# Patient Record
Sex: Male | Born: 1985 | Race: White | Hispanic: No | Marital: Married | Smoking: Former smoker
Health system: Southern US, Community
[De-identification: ages and names within clinical notes are randomized; demographics above are authoritative.]

## PROBLEM LIST (undated history)

## (undated) DIAGNOSIS — Z8619 Personal history of other infectious and parasitic diseases: Secondary | ICD-10-CM

## (undated) DIAGNOSIS — E785 Hyperlipidemia, unspecified: Secondary | ICD-10-CM

## (undated) DIAGNOSIS — R194 Change in bowel habit: Secondary | ICD-10-CM

## (undated) DIAGNOSIS — Z8709 Personal history of other diseases of the respiratory system: Secondary | ICD-10-CM

## (undated) DIAGNOSIS — K219 Gastro-esophageal reflux disease without esophagitis: Secondary | ICD-10-CM

## (undated) DIAGNOSIS — T7840XA Allergy, unspecified, initial encounter: Secondary | ICD-10-CM

## (undated) DIAGNOSIS — J45909 Unspecified asthma, uncomplicated: Secondary | ICD-10-CM

## (undated) HISTORY — DX: Unspecified asthma, uncomplicated: J45.909

## (undated) HISTORY — DX: Change in bowel habit: R19.4

## (undated) HISTORY — DX: Hyperlipidemia, unspecified: E78.5

## (undated) HISTORY — DX: Allergy, unspecified, initial encounter: T78.40XA

## (undated) HISTORY — DX: Personal history of other diseases of the respiratory system: Z87.09

## (undated) HISTORY — DX: Gastro-esophageal reflux disease without esophagitis: K21.9

## (undated) HISTORY — DX: Personal history of other infectious and parasitic diseases: Z86.19

---

## 2008-03-04 LAB — HIV ANTIBODY (ROUTINE TESTING W REFLEX): HIV 1&2 Ab, 4th Generation: NEGATIVE

## 2010-08-08 ENCOUNTER — Ambulatory Visit: Payer: Self-pay | Admitting: Family Medicine

## 2011-01-28 ENCOUNTER — Encounter: Payer: Self-pay | Admitting: Family Medicine

## 2011-01-28 ENCOUNTER — Ambulatory Visit (INDEPENDENT_AMBULATORY_CARE_PROVIDER_SITE_OTHER): Payer: Managed Care, Other (non HMO) | Admitting: Family Medicine

## 2011-01-28 DIAGNOSIS — E785 Hyperlipidemia, unspecified: Secondary | ICD-10-CM | POA: Insufficient documentation

## 2011-01-28 DIAGNOSIS — Z0000 Encounter for general adult medical examination without abnormal findings: Secondary | ICD-10-CM

## 2011-01-28 DIAGNOSIS — Z8709 Personal history of other diseases of the respiratory system: Secondary | ICD-10-CM

## 2011-01-28 DIAGNOSIS — Z23 Encounter for immunization: Secondary | ICD-10-CM

## 2011-01-28 NOTE — Progress Notes (Signed)
Addended by: Josph Macho A on: 01/28/2011 11:18 AM   Modules accepted: Orders

## 2011-01-28 NOTE — Assessment & Plan Note (Signed)
Reviewed preventative protocols, updated. Tdap today. Pt to bring me copy of recent blood work. Seatbelt 100%, sunscreen use reviewed. Discussed healthy diet, lifestyle.

## 2011-01-28 NOTE — Patient Instructions (Signed)
Tdap today (tetanus and pertussis). Work on incorporating activity into routine, more fish and vegetables in diet. Drop off a copy of recent blood work or email me a copy to have in your chart [email or web address] Good to meet you today, call us with questions. Return in 1-2 years for next checkup, or as needed.

## 2011-01-28 NOTE — Progress Notes (Signed)
Subjective:    Patient ID: Jason Wood, male    DOB: [birth year], 25 y.o.   MRN: [id number]  HPI CC: new pt, establish  No concerns today, would like physical.  Preventative: Blood work last year - chol elevated last 3 years (borderline).  Will bring copy of blood work. unsure last tetanus >10 yrs. Flu shot this year 2012. Not fasting today.  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  Medications and allergies reviewed and updated in chart.  Past histories reviewed and updated if relevant as below. There is no problem list on file for this patient.  Past Medical History  Diagnosis Date  . History of asthma     as child  . History of chicken pox   . HLD (hyperlipidemia)     borderline   No past surgical history on file. History  Substance Use Topics  . Smoking status: Never Smoker   . Smokeless tobacco: Never Used  . Alcohol Use: No   Family History  Problem Relation Age of Onset  . Diabetes Father   . Cancer Father     skin  . Glaucoma Father   . Cancer Maternal Grandmother     lung, smoker  . Stroke Maternal Grandmother   . Cancer Paternal Grandmother     breast  . Diabetes Paternal Grandfather   . Cancer Paternal Grandfather 40    prostate  . Coronary artery disease Neg Hx    Allergies  Allergen Reactions  . Sulfa Drugs Cross Reactors Rash   No current outpatient prescriptions on file prior to visit.   Review of Systems  Constitutional: Negative for fever, chills, activity change, appetite change, fatigue and unexpected weight change.  HENT: Negative for hearing loss and neck pain.   Eyes: Negative for visual disturbance.  Respiratory: Negative for cough, chest tightness, shortness of breath and wheezing.   Cardiovascular: Negative for chest pain,  palpitations and leg swelling.  Gastrointestinal: Negative for nausea, vomiting, abdominal pain, diarrhea, constipation, blood in stool and abdominal distention.  Genitourinary: Negative for hematuria and difficulty urinating.  Musculoskeletal: Negative for myalgias and arthralgias.  Skin: Negative for rash.  Neurological: Negative for dizziness, seizures, syncope and headaches.  Hematological: Does not bruise/bleed easily.  Psychiatric/Behavioral: Negative for dysphoric mood. The patient is not nervous/anxious.        Objective:   Physical Exam  Nursing note and vitals reviewed. Constitutional: He is oriented to person, place, and time. He appears well-developed and well-nourished. No distress.  HENT:  Head: Normocephalic and atraumatic.  Right Ear: External ear normal.  Left Ear: External ear normal.  Nose: Nose normal.  Mouth/Throat: Oropharynx is clear and moist. No oropharyngeal exudate.  Eyes: Conjunctivae and EOM are normal. Pupils are equal, round, and reactive to light. No scleral icterus.  Neck: Normal range of motion. Neck supple. No thyromegaly present.  Cardiovascular: Normal rate, regular rhythm, normal heart sounds and intact distal pulses.   No murmur heard. Pulses:      Radial pulses are 2+ on the right side, and 2+ on the left side.  Pulmonary/Chest: Effort normal and breath sounds normal. No respiratory distress. He has no wheezes. He has no rales.  Abdominal: Soft. Bowel sounds are normal. He exhibits no distension and no mass. There is no tenderness. There is no rebound  and no guarding.  Musculoskeletal: Normal range of motion.  Lymphadenopathy:    He has no cervical adenopathy.  Neurological: He is alert and oriented to person, place, and time.       CN grossly intact, station and gait intact  Skin: Skin is warm and dry. No rash noted.  Psychiatric: He has a normal mood and affect. His behavior is normal. Judgment and thought content normal.         Assessment & Plan:

## 2011-01-28 NOTE — Assessment & Plan Note (Signed)
Pt to bring me copy of recent blood work.

## 2011-01-31 ENCOUNTER — Ambulatory Visit: Payer: Self-pay | Admitting: Family Medicine

## 2012-04-07 ENCOUNTER — Encounter: Payer: Self-pay | Admitting: Family Medicine

## 2012-04-07 ENCOUNTER — Ambulatory Visit (INDEPENDENT_AMBULATORY_CARE_PROVIDER_SITE_OTHER): Payer: Managed Care, Other (non HMO) | Admitting: Family Medicine

## 2012-04-07 VITALS — BP 118/66 | HR 86 | Temp 100.5°F | Wt 200.5 lb

## 2012-04-07 DIAGNOSIS — J45901 Unspecified asthma with (acute) exacerbation: Secondary | ICD-10-CM

## 2012-04-07 DIAGNOSIS — R05 Cough: Secondary | ICD-10-CM

## 2012-04-07 DIAGNOSIS — J111 Influenza due to unidentified influenza virus with other respiratory manifestations: Secondary | ICD-10-CM

## 2012-04-07 MED ORDER — OSELTAMIVIR PHOSPHATE 75 MG PO CAPS: 75.0000 mg | ORAL_CAPSULE | Freq: Two times a day (BID) | ORAL | Status: DC

## 2012-04-07 MED ORDER — IPRATROPIUM BROMIDE 0.02 % IN SOLN: 0.5000 mg | Freq: Once | RESPIRATORY_TRACT | Status: DC

## 2012-04-07 MED ORDER — PREDNISONE 20 MG PO TABS: ORAL_TABLET | ORAL | Status: DC

## 2012-04-07 MED ORDER — ALBUTEROL SULFATE (2.5 MG/3ML) 0.083% IN NEBU: 2.5000 mg | INHALATION_SOLUTION | Freq: Once | RESPIRATORY_TRACT | Status: DC

## 2012-04-07 MED ORDER — ALBUTEROL SULFATE HFA 108 (90 BASE) MCG/ACT IN AERS: 2.0000 | INHALATION_SPRAY | Freq: Four times a day (QID) | RESPIRATORY_TRACT | Status: DC | PRN

## 2012-04-07 NOTE — Assessment & Plan Note (Addendum)
See pt instructions for plan. Given h/o asthma, will treat with tamiflu, w/in 48 hours of starting sxs. Advised to call children's doctors for tamiflu preventatively - actually Dr. Elmer Sow patients so will send note to her. Out of work until fever free for 24 hours, declines work note.

## 2012-04-07 NOTE — Patient Instructions (Addendum)
You have influenza that has caused asthma flare. Treat with albuterol inhaler scheduled for next several days (may make you jittery). Tamiflu prescription also sent in today Steroid course provided in case asthma flare not improving with albuterol alone. Flu test today - positive Watch for persistent fevers >101, worsening productive cough, or not improving as expected.  Influenza, Adult Influenza ("the flu") is a viral infection of the respiratory tract. It occurs more often in winter months because people spend more time in close contact with one another. Influenza can make you feel very sick. Influenza easily spreads from person to person (contagious). CAUSES  Influenza is caused by a virus that infects the respiratory tract. You can catch the virus by breathing in droplets from an infected person's cough or sneeze. You can also catch the virus by touching something that was recently contaminated with the virus and then touching your mouth, nose, or eyes. SYMPTOMS  Symptoms typically last 4 to 10 days and may include:  Fever.  Chills.  Headache, body aches, and muscle aches.  Sore throat.  Chest discomfort and cough.  Poor appetite.  Weakness or feeling tired.  Dizziness.  Nausea or vomiting. DIAGNOSIS  Diagnosis of influenza is often made based on your history and a physical exam. A nose or throat swab test can be done to confirm the diagnosis. RISKS AND COMPLICATIONS You may be at risk for a more severe case of influenza if you smoke cigarettes, have diabetes, have chronic heart disease (such as heart failure) or lung disease (such as asthma), or if you have a weakened immune system. Elderly people and pregnant women are also at risk for more serious infections. The most common complication of influenza is a lung infection (pneumonia). Sometimes, this complication can require emergency medical care and may be life-threatening. PREVENTION  An annual influenza vaccination (flu  shot) is the best way to avoid getting influenza. An annual flu shot is now routinely recommended for all adults in the [place] TREATMENT  In mild cases, influenza goes away on its own. Treatment is directed at relieving symptoms. For more severe cases, your caregiver may prescribe antiviral medicines to shorten the sickness. Antibiotic medicines are not effective, because the infection is caused by a virus, not by bacteria. HOME CARE INSTRUCTIONS  Only take over-the-counter or prescription medicines for pain, discomfort, or fever as directed by your caregiver.  Use a cool mist humidifier to make breathing easier.  Get plenty of rest until your temperature returns to normal. This usually takes 3 to 4 days.  Drink enough fluids to keep your urine clear or pale yellow.  Cover your mouth and nose when coughing or sneezing, and wash your hands well to avoid spreading the virus.  Stay home from work or school until your fever has been gone for at least 1 full day. SEEK MEDICAL CARE IF:   You have chest pain or a deep cough that worsens or produces more mucus.  You have nausea, vomiting, or diarrhea. SEEK IMMEDIATE MEDICAL CARE IF:   You have difficulty breathing, shortness of breath, or your skin or nails turn bluish.  You have severe neck pain or stiffness.  You have a severe headache, facial pain, or earache.  You have a worsening or recurring fever.  You have nausea or vomiting that cannot be controlled. MAKE SURE YOU:  Understand these instructions.  Will watch your condition.  Will get help right away if you are not doing well or get worse. Document Released:  02/16/2000 Document Revised: 08/20/2011 Document Reviewed: 05/20/2011 [facility] Patient Information 2013 [place], [place].

## 2012-04-07 NOTE — Assessment & Plan Note (Signed)
Brought on by influenza - treat with alb/atrovent neb in office, albuterol inhaler prescription, and provided with steroid course in case asthma flare not improving as expected.

## 2012-04-07 NOTE — Progress Notes (Signed)
  Subjective:    Patient ID: Jason Wood, male    DOB: [birth year], 27 y.o.   MRN: [id number]  HPI CC: cough  Presents with wife, Morrie Sheldon.  2d h/o chest congestion, yesterday started coughing up green sputum.  Fever today to 100.5.  + chills.  + PNDrainage.  Chest > head congestion.  + body aches, malaise.  + SOB.  No wheezing.  So far has tried tylenol, nyquil.  No abd pain, n/v, ear or tooth pain, ST.  + sick contacts at home. No smokers at home. H/o asthma as child, not on meds for this.  No need for inhaler recently.  Did receive flu shot this year.  Past Medical History  Diagnosis Date  . History of asthma     as child  . History of chicken pox   . HLD (hyperlipidemia)     borderline     Review of Systems Per HPI    Objective:   Physical Exam  Nursing note and vitals reviewed. Constitutional: He appears well-developed and well-nourished. No distress.  HENT:  Head: Normocephalic and atraumatic.  Right Ear: Tympanic membrane, external ear and ear canal normal.  Left Ear: Tympanic membrane, external ear and ear canal normal.  Nose: Nose normal. No mucosal edema or rhinorrhea. Right sinus exhibits no maxillary sinus tenderness and no frontal sinus tenderness. Left sinus exhibits no maxillary sinus tenderness and no frontal sinus tenderness.  Mouth/Throat: Oropharynx is clear and moist. No oropharyngeal exudate.  Eyes: Conjunctivae normal and EOM are normal. Pupils are equal, round, and reactive to light. No scleral icterus.  Neck: Normal range of motion. Neck supple.  Cardiovascular: Normal rate, regular rhythm, normal heart sounds and intact distal pulses.   No murmur heard. Pulmonary/Chest: Effort normal. No respiratory distress. He has no decreased breath sounds. He has wheezes (faint end exp wheezing). He has no rhonchi. He has no rales.       tachypneic  Lymphadenopathy:    He has no cervical adenopathy.  Skin: Skin is warm and dry. No rash noted.        Assessment & Plan:

## 2013-09-08 ENCOUNTER — Telehealth: Payer: Self-pay

## 2013-09-08 NOTE — Telephone Encounter (Signed)
Pt left v/m requesting date of last tetanus inj. Left v/m pt received Tdap on 01/28/2011.

## 2014-06-08 ENCOUNTER — Ambulatory Visit (INDEPENDENT_AMBULATORY_CARE_PROVIDER_SITE_OTHER): Payer: Self-pay | Admitting: Family Medicine

## 2014-06-08 ENCOUNTER — Encounter: Payer: Self-pay | Admitting: Family Medicine

## 2014-06-08 VITALS — BP 116/78 | HR 81 | Temp 98.3°F | Ht 72.0 in | Wt 217.8 lb

## 2014-06-08 DIAGNOSIS — J069 Acute upper respiratory infection, unspecified: Secondary | ICD-10-CM

## 2014-06-08 LAB — POCT RAPID STREP A (OFFICE): RAPID STREP A SCREEN: NEGATIVE

## 2014-06-08 NOTE — Addendum Note (Signed)
Addended by: Amado Coe on: 06/08/2014 10:23 AM   Modules accepted: Orders

## 2014-06-08 NOTE — Progress Notes (Signed)
   Dr. Frederico Hamman T. Ervin Hensley, MD, [facility] [place], [address] Phone: [phone] Fax: [phone]  06/08/2014  Patient: Jason Wood., MRN: [id number], DOB: Feb 28, 1986, 29 y.o.  Primary Physician:  Ria Bush, MD  Chief Complaint: URI  Subjective:   This 29 y.o. male patient presents with runny nose, sneezing, cough, sore throat, malaise and minimal / low-grade fever . Kids were sick last week. Sunday started to feel sick - now out of class for 3 days. Felt exhausted, no muscle aches, no joint aches, some sinus congestion.   Some asthma. No inhalers. No fever.   + recent exposure to others with similar symptoms.   The patent denies sore throat as the primary complaint. Denies sthortness of breath/wheezing, high fever, chest pain, rhinits for more than 14 days, significant myalgia, otalgia, facial pain, abdominal pain, changes in bowel or bladder.  PMH, PHS, Allergies, Problem List, Medications, Family History, and Social History have all been reviewed.  ROS as above, eating and drinking - tolerating PO. Urinating normally. No excessive vomitting or diarrhea. O/w as above.  Objective:   Blood pressure 116/78, pulse 81, temperature 98.3 F (36.8 C), temperature source Oral, height 6' (1.829 m), weight 217 lb 12.8 oz (98.793 kg), SpO2 97 %.  GEN: WDWN, Non-toxic, Atraumatic, normocephalic. A and O x 3. HEENT: Oropharynx clear without exudate, MMM, no significant LAD, mild rhinnorhea Ears: TM clear, COL visualized with good landmarks CV: RRR, no m/g/r. Pulm: CTA B, no wheezes, rhonchi, or crackles, normal respiratory effort. EXT: no c/c/e Psych: well oriented, neither depressed nor anxious in appearance  Objective Data:  Assessment and Plan:   Upper respiratory infection  Supportive care reviewed with patient. See patient instruction section.  Follow-up: Return if symptoms worsen or fail to  improve.  Signed,  Maud Deed. Amar Sippel, MD   Patient's Medications  New Prescriptions   No medications on file  Previous Medications   ALBUTEROL (PROVENTIL HFA;VENTOLIN HFA) 108 (90 BASE) MCG/ACT INHALER    Inhale 2 puffs into the lungs every 6 (six) hours as needed for wheezing.  Modified Medications   No medications on file  Discontinued Medications   OSELTAMIVIR (TAMIFLU) 75 MG CAPSULE    Take 1 capsule (75 mg total) by mouth 2 (two) times daily.   PREDNISONE (DELTASONE) 20 MG TABLET    Take two pills for 4 days followed by one pill for 4 days

## 2014-06-08 NOTE — Progress Notes (Signed)
Pre visit review using our clinic review tool, if applicable. No additional management support is needed unless otherwise documented below in the visit note. 

## 2017-03-03 ENCOUNTER — Other Ambulatory Visit: Payer: Self-pay

## 2017-03-03 ENCOUNTER — Telehealth: Payer: Self-pay

## 2017-03-03 ENCOUNTER — Ambulatory Visit (INDEPENDENT_AMBULATORY_CARE_PROVIDER_SITE_OTHER): Payer: Self-pay | Admitting: Family Medicine

## 2017-03-03 ENCOUNTER — Encounter: Payer: Self-pay | Admitting: Family Medicine

## 2017-03-03 VITALS — BP 104/70 | HR 76 | Temp 98.7°F | Ht 72.0 in | Wt 218.5 lb

## 2017-03-03 DIAGNOSIS — J020 Streptococcal pharyngitis: Secondary | ICD-10-CM

## 2017-03-03 DIAGNOSIS — J029 Acute pharyngitis, unspecified: Secondary | ICD-10-CM

## 2017-03-03 LAB — POCT RAPID STREP A (OFFICE): Rapid Strep A Screen: POSITIVE — AB

## 2017-03-03 MED ORDER — AMOXICILLIN 875 MG PO TABS: 875.0000 mg | ORAL_TABLET | Freq: Two times a day (BID) | ORAL | 0 refills | Status: DC

## 2017-03-03 NOTE — Telephone Encounter (Signed)
Pt already has appt with Dr Lorelei Pont 03/03/17 at 10:45.

## 2017-03-03 NOTE — Telephone Encounter (Signed)
PLEASE NOTE: All timestamps contained within this report are represented as [place] Standard Time. CONFIDENTIALTY NOTICE: This fax transmission is intended only for the addressee. It contains information that is legally privileged, confidential or otherwise protected from use or disclosure. If you are not the intended recipient, you are strictly prohibited from reviewing, disclosing, copying using or disseminating any of this information or taking any action in reliance on or regarding this information. If you have received this fax in error, please notify us immediately by telephone so that we can arrange for its return to [place]. Phone: [phone], Toll-Free: [phone], Fax: [phone] Page: 1 of 2 Call Id: [id number] [place] Patient Name: Jason Wood Gender: Male DOB: Sep 07, 1985 Age: 31 Y 5 M 3 D Return Phone Number: [phone] (Primary) Address: City/State/Zip: [place] [address] Client [facility] - Client Client Site [facility] Physician Ria Bush - MD Contact Type Call Who Is Calling Patient / Member / Family / Caregiver Call Type Triage / Clinical Caller Name Kavan Devan Relationship To Patient Spouse Return Phone Number [phone] (Primary) Chief Complaint Sore Throat Reason for Call Symptomatic / Request for [place] states her husband was exposed to strep and now his throat is sore with a fever of 100.5 Translation No Nurse Assessment Nurse: Thad Ranger, RN, Langley Gauss Date/Time (Eastern Time): 03/02/2017 9:45:59 AM Confirm and document reason for call. If symptomatic, describe symptoms. ---Caller states her husband was exposed to strep and now his throat is sore with a fever of 100.5 Pt states dtr had strep last wk Does the patient have any new or worsening symptoms? ---Yes Will a  triage be completed? ---Yes Related visit to physician within the last 2 weeks? ---No Does the PT have any chronic conditions? (i.e. diabetes, asthma, etc.) ---No Is this a behavioral health or substance abuse call? ---No Guidelines Guideline Title Affirmed Question Affirmed Notes Nurse Date/Time Eilene Ghazi Time) Strep Throat Exposure [1] Sore throat AND [2] strep throat EXPOSURE (i.e., meets definition) within past 10 days Carmon, RN, Langley Gauss 03/02/2017 9:46:38 AM Disp. Time Eilene Ghazi Time) Disposition Final User 03/02/2017 9:49:27 AM Call PCP within 24 Hours Yes Carmon, RN, Yevette Edwards Disagree/Comply Comply Caller Understands Yes PreDisposition Call Doctor PLEASE NOTE: All timestamps contained within this report are represented as [place] Standard Time. CONFIDENTIALTY NOTICE: This fax transmission is intended only for the addressee. It contains information that is legally privileged, confidential or otherwise protected from use or disclosure. If you are not the intended recipient, you are strictly prohibited from reviewing, disclosing, copying using or disseminating any of this information or taking any action in reliance on or regarding this information. If you have received this fax in error, please notify us immediately by telephone so that we can arrange for its return to [place]. Phone: [phone], Toll-Free: [phone], Fax: [phone] Page: 2 of 2 Call Id: [id number] Care Advice Given Per Guideline CALL PCP WITHIN 24 HOURS: You need to discuss this with your doctor within the next 24 hours. SORE THROAT - For relief of sore throat: * Sip warm chicken broth or apple juice. * Suck on hard candy or a throat lozenge (OTC). * Gargle with warm salt water four times a day. To make salt water, put 1/2 teaspoon of salt in 8 oz (240 ml) of warm water. * Avoid cigarette smoke. SOFT DIET: * Eat a soft diet. * Cold  drinks, popsicles, and milk shakes are especially good. Avoid citrus fruits.  DRINK PLENTY LIQUIDS: * Drink plenty of liquids. This is important to prevent dehydation. PAIN OR FEVER MEDICINES: IBUPROFEN (E.G., MOTRIN, ADVIL): * Take 400 mg (two 200 mg pills) by mouth every 6 hours as needed. * Another choice is to take 600 mg (three 200 mg pills) by mouth every 8 hours as needed. CALL BACK IF: * You become worse. CARE ADVICE given per Strep Throat Exposure (Adult) guideline. Referrals REFERRED TO PCP OFFICE

## 2017-03-03 NOTE — Progress Notes (Signed)
Dr. Frederico Hamman T. Conroy Goracke, MD, [facility] and Sports Medicine [place], [address] Phone: [phone] Fax: [phone]  03/03/2017  Patient: Jason Cubit., MRN: [id number], DOB: March 17, 1985, 31 y.o.  Primary Physician:  Ria Bush, MD   Chief Complaint  Patient presents with  . Sore Throat   Subjective:   This 31 y.o. male patient presents with sore throat for 14 days. Subjective fevers, achiness, headache. Some nausea. No significant URI sx. No significant cough.  Son with strep  The PMH, PSH, Social History, Family History, Medications, and allergies have been reviewed in [facility], and have been updated if relevant.   Patient Active Problem List   Diagnosis Date Noted  . Asthma flare 04/07/2012  . Influenza 04/07/2012  . Healthcare maintenance 01/28/2011  . History of asthma   . HLD (hyperlipidemia)     Past Medical History:  Diagnosis Date  . History of asthma    as child  . History of chicken pox   . HLD (hyperlipidemia)    borderline    History reviewed. No pertinent surgical history.  Social History   Socioeconomic History  . Marital status: Married    Spouse name: Not on file  . Number of children: Not on file  . Years of education: Not on file  . Highest education level: Not on file  Social Needs  . Financial resource strain: Not on file  . Food insecurity - worry: Not on file  . Food insecurity - inability: Not on file  . Transportation needs - medical: Not on file  . Transportation needs - non-medical: Not on file  Occupational History  . Not on file  Tobacco Use  . Smoking status: Never Smoker  . Smokeless tobacco: Never Used  Substance and Sexual Activity  . Alcohol use: No  . Drug use: No  . Sexual activity: Not on file  Other Topics Concern  . Not on file  Social History Narrative   Caffeine: 1 cup green tea   Lives with wife, parents, son (2011), 2 outside cats   Occupation:  Optometrist for [employer]   Edu: BA   Activity: works outside on weekends   Diet: fruits daily, some vegetables, water, red meat 2x/wk, fish every other week    Family History  Problem Relation Age of Onset  . Diabetes Father   . Cancer Father        skin  . Glaucoma Father   . Cancer Maternal Grandmother        lung, smoker  . Stroke Maternal Grandmother   . Cancer Paternal Grandmother        breast  . Diabetes Paternal Grandfather   . Cancer Paternal Grandfather 74       prostate  . Coronary artery disease Neg Hx     Allergies  Allergen Reactions  . Sulfa Drugs Cross Reactors Rash    Medication list reviewed and updated in full in [facility].  GEN: Acute illness details above GI: Tolerating PO intake GU: maintaining adequate hydration and urination Pulm: No SOB Interactive and getting along well at home. Otherwise, ROS is as per the HPI.  Objective:   Blood pressure 104/70, pulse 76, temperature 98.7 F (37.1 C), temperature source Oral, height 6' (1.829 m), weight 218 lb 8 oz (99.1 kg).  Gen: WDWN, NAD; A & O x3, cooperative. Pleasant.Globally Non-toxic HEENT: Normocephalic and atraumatic. Throat: swollen tonsills without exudate R TM clear, L  TM - good landmarks, No fluid present. rhinnorhea. No frontal or maxillary sinus T. MMM NECK: Anterior cervical  LAD is present - TTP CV: RRR, No M/G/R, cap refill <2 sec PULM: Breathing comfortably in no respiratory distress. no wheezing, crackles, rhonchi EXT: No c/c/e PSYCH: Friendly, good eye contact MSK: Nml gait   Results for orders placed or performed in visit on 03/03/17  POCT rapid strep A  Result Value Ref Range   Rapid Strep A Screen Positive (A) Negative    Assessment & Plan:   Strep throat  Sore throat - Plan: POCT rapid strep A  Strep test + Treat with ABX Supportive care  Follow-up: No Follow-up on file.  Meds ordered this encounter  Medications  . amoxicillin (AMOXIL) 875 MG tablet     Sig: Take 1 tablet (875 mg total) by mouth 2 (two) times daily.    Dispense:  20 tablet    Refill:  0    Orders Placed This Encounter  Procedures  . POCT rapid strep A    Signed,  Maximillion Gill T. Sevana Grandinetti, MD     Medication List        Accurate as of 03/03/17 11:59 PM. Always use your most recent med list.          amoxicillin 875 MG tablet Commonly known as:  AMOXIL Take 1 tablet (875 mg total) by mouth 2 (two) times daily.       Where to Get Your Medications    These medications were sent to [facility] [address]  [place], [place] [address]   Phone:  [phone]   amoxicillin 875 MG tablet

## 2018-07-29 ENCOUNTER — Other Ambulatory Visit: Payer: Self-pay

## 2018-07-29 ENCOUNTER — Encounter: Payer: Self-pay | Admitting: Internal Medicine

## 2018-07-29 ENCOUNTER — Ambulatory Visit (INDEPENDENT_AMBULATORY_CARE_PROVIDER_SITE_OTHER): Payer: Self-pay | Admitting: Internal Medicine

## 2018-07-29 VITALS — Ht 71.0 in | Wt 215.0 lb

## 2018-07-29 DIAGNOSIS — K219 Gastro-esophageal reflux disease without esophagitis: Secondary | ICD-10-CM

## 2018-07-29 DIAGNOSIS — R198 Other specified symptoms and signs involving the digestive system and abdomen: Secondary | ICD-10-CM

## 2018-07-29 DIAGNOSIS — K5900 Constipation, unspecified: Secondary | ICD-10-CM

## 2018-07-29 DIAGNOSIS — R1013 Epigastric pain: Secondary | ICD-10-CM

## 2018-07-29 DIAGNOSIS — R109 Unspecified abdominal pain: Secondary | ICD-10-CM

## 2018-07-29 NOTE — Progress Notes (Signed)
Patient ID: Jason Munda., male   DOB: 1985-04-10, 33 y.o.   MRN: [id number]  This service was provided via telemedicine.  Doximity app with A/V communication [place] The provider was located in provider's GI office. The patient did consent to this telephone visit and is aware of possible charges through their insurance for this visit.   The persons participating in this telemedicine service were the patient, his wife and I. Time spent on call: 26 minutes  HPI: Jason Wood is a 33 year old male with little past medical history who is seen today to discuss change in bowel habits, constipation and indigestion.  He is seen by Doximity app with A/V communication in the setting of COVID-19 pandemic.  He reports that in the last 5 months predominantly this year he has developed a change in his bowel habit.  Initially he had constipation alternating with diarrhea but over the last several months has had constipation predominance.  He reports having small stools in "hard balls" or either stools that are narrow caliber or flat.  No blood in his stool or melena but he has had mucus in his stool.  He has developed eye discomfort above his umbilicus.  This is present rather often but can be quite sharp at times.  At times it can feel cramping in nature.  He also reports feeling incomplete evacuation.  Prior to this year he would go once per day with more long larger caliber stool or possibly every other day.  He has felt a lower abdominal fullness as well as malaise and nausea.  He has used laxatives periodically and most recently took a bottle of magnesium citrate because it had been 5 days since bowel movement.  He did have a complete bowel movement/emptying with this medicine.  Perhaps separate from this he had 2 or 3 weeks of indigestion and sour stomach.  He also reported sour or "sulfur belching".  This has gone away after using Prilosec 20 mg for a week and then twice  daily for about a week.  He has been off of Prilosec for 5 to 7 days without return of the symptoms.  He does have some mild nausea at times.  No epigastric pain.  No heartburn.  No dysphagia or odynophagia.  His weight has been stable.  He reports his father "lives on Prilosec" and his mother has had issues with her digestive system but no known celiac disease, inflammatory bowel disease or GI tract malignancy.  Past Medical History:  Diagnosis Date  . History of asthma    as child  . History of chicken pox   . HLD (hyperlipidemia)    borderline    Past Surgical History:  Procedure Laterality Date  . none      Outpatient Medications Prior to Visit  Medication Sig Dispense Refill  . amoxicillin (AMOXIL) 875 MG tablet Take 1 tablet (875 mg total) by mouth 2 (two) times daily. 20 tablet 0   No facility-administered medications prior to visit.     Allergies  Allergen Reactions  . Sulfa Drugs Cross Reactors Rash    Family History  Problem Relation Age of Onset  . Diabetes Father   . Cancer Father        skin  . Glaucoma Father   . Cancer Maternal Grandmother        lung, smoker  . Stroke Maternal Grandmother   . Cancer Paternal Grandmother  breast  . Diabetes Paternal Grandfather   . Cancer Paternal Grandfather 29       prostate  . Coronary artery disease Neg Hx   . Colon cancer Neg Hx   . Esophageal cancer Neg Hx   . Stomach cancer Neg Hx   . Pancreatic cancer Neg Hx   . Liver disease Neg Hx     Social History   Tobacco Use  . Smoking status: Former Research scientist (life sciences)  . Smokeless tobacco: Never Used  . Tobacco comment: in college-rare  Substance Use Topics  . Alcohol use: No  . Drug use: No    ROS: As per history of present illness, otherwise negative  Ht 5\' 11"  (1.803 m)   Wt 215 lb (97.5 kg)   BMI 29.99 kg/m  No physical exam, virtual visit   ASSESSMENT/PLAN: 33 year old male with little past medical history who is seen today to discuss change in  bowel habits, constipation and indigestion.   1.  Change in bowel habits/constipation//mid abdominal pain/indigestion --persistent symptoms particularly lower GI symptoms over the last 4 to 5 months and more recently upper GI symptoms as well.  We discussed further evaluation of the symptoms and I have recommended the following --TSH, CBC and celiac panel --Begin Benefiber 1 heaping tablespoon working to 2 heaping tablespoons daily --Mag citrate can be used once or twice per week if needed until further evaluation --Upper endoscopy and colonoscopy; scheduled for 08/13/2018 at 2:30 PM.  We discussed the risk, benefits and alternatives and he is agreeable and wishes to proceed.  Rule out H. pylori, IBD, structural colonic lesion. --Based on endoscopy findings we may consider Linzess or other daily laxative for some time to improve bowel function   PY:[id number], [address] [place], [place], [place] [address]

## 2018-07-30 NOTE — Patient Instructions (Addendum)
Your provider has requested that you go to the basement level for lab work. Press "B" on the elevator. The lab is located at the first door on the left as you exit the elevator.  Please purchase the following medications over the counter and take as directed: Benefiber-1 heaping tablespoon working to 2 heaping tablespoons daily Magnesium citrate-this can be used once or twice per week if needed until further evaluation  You have been scheduled for upper endoscopy and colonoscopy;  scheduled for 08/13/2018 at 2:30 PM.

## 2018-08-03 HISTORY — PX: COLONOSCOPY WITH ESOPHAGOGASTRODUODENOSCOPY (EGD): SHX5779

## 2018-08-04 ENCOUNTER — Other Ambulatory Visit (INDEPENDENT_AMBULATORY_CARE_PROVIDER_SITE_OTHER): Payer: Self-pay

## 2018-08-04 ENCOUNTER — Other Ambulatory Visit: Payer: Self-pay

## 2018-08-04 ENCOUNTER — Ambulatory Visit: Payer: Self-pay

## 2018-08-04 VITALS — Ht 71.0 in | Wt 215.0 lb

## 2018-08-04 DIAGNOSIS — K219 Gastro-esophageal reflux disease without esophagitis: Secondary | ICD-10-CM

## 2018-08-04 DIAGNOSIS — R194 Change in bowel habit: Secondary | ICD-10-CM

## 2018-08-04 DIAGNOSIS — R109 Unspecified abdominal pain: Secondary | ICD-10-CM

## 2018-08-04 DIAGNOSIS — K5900 Constipation, unspecified: Secondary | ICD-10-CM

## 2018-08-04 LAB — CBC WITH DIFFERENTIAL/PLATELET
Basophils Absolute: 0 10*3/uL (ref 0.0–0.1)
Basophils Relative: 0.6 % (ref 0.0–3.0)
Eosinophils Absolute: 0.2 10*3/uL (ref 0.0–0.7)
Eosinophils Relative: 3.8 % (ref 0.0–5.0)
HCT: 43.3 % (ref 39.0–52.0)
Hemoglobin: 14.7 g/dL (ref 13.0–17.0)
Lymphocytes Relative: 34.7 % (ref 12.0–46.0)
Lymphs Abs: 2.2 10*3/uL (ref 0.7–4.0)
MCHC: 34 g/dL (ref 30.0–36.0)
MCV: 82 fl (ref 78.0–100.0)
Monocytes Absolute: 0.5 10*3/uL (ref 0.1–1.0)
Monocytes Relative: 8.5 % (ref 3.0–12.0)
Neutro Abs: 3.3 10*3/uL (ref 1.4–7.7)
Neutrophils Relative %: 52.4 % (ref 43.0–77.0)
Platelets: 187 10*3/uL (ref 150.0–400.0)
RBC: 5.28 Mil/uL (ref 4.22–5.81)
RDW: 13.4 % (ref 11.5–15.5)
WBC: 6.3 10*3/uL (ref 4.0–10.5)

## 2018-08-04 LAB — IGA: IgA: 119 mg/dL (ref 68–378)

## 2018-08-04 LAB — TSH: TSH: 3.18 u[IU]/mL (ref 0.35–4.50)

## 2018-08-04 MED ORDER — NA SULFATE-K SULFATE-MG SULF 17.5-3.13-1.6 GM/177ML PO SOLN: 1.0000 | Freq: Once | ORAL | 0 refills | Status: AC

## 2018-08-04 NOTE — Progress Notes (Signed)
Per pt, no allergies to soy or egg products.Pt not taking any weight loss meds or using  O2 at home. Pt denies sedation problem.  Pt refused emmi video. The PV was done over the phone due to COVID-19. I verified the pt's address and he is a self pay-no insurance. I reviewed medical history and prep instructions with the pt and will mail the paperwork to the pt today. Informed pt to call our office with any questions or changes prior to his procedures. He understood.

## 2018-08-05 LAB — TISSUE TRANSGLUTAMINASE, IGA: (tTG) Ab, IgA: 1 U/mL

## 2018-08-06 ENCOUNTER — Telehealth: Payer: Self-pay

## 2018-08-06 ENCOUNTER — Ambulatory Visit: Payer: Self-pay

## 2018-08-06 NOTE — Progress Notes (Signed)
The bowel prep was changed to Plenvu. A sample of Plenvu lot #[id number],Exp 12/2018 was left at the front desk of the 3rd floor with new Plenvu instructions. The pt was notified and will call if he has any questions. Gwyndolyn Saxon in [facility]

## 2018-08-06 NOTE — Telephone Encounter (Signed)
I spoke with pt; pt has had H/A on and off for 2 wks.pt has swollen lymph nodes on lt side and today lt side of face is swollen and numb. No facial drooping. Neck muscles in back of neck hurt; no tongue or throat swelling.No loss of taste/smell,no fever,chills, cough,S/T,SOB and diarrhea. Pt has dermatology appt later this afternoon and is presently in [place]. Pt will go to [facility] for eval now. FYI to Dr Darnell Level.

## 2018-08-06 NOTE — Telephone Encounter (Signed)
Incoming call from Pt.  With a complaint of swollen lymp node  Near his jaw.  Rates pain mildly now.  Pain has been high as 7.  Somes times  Numbeness occurs.   It is on the left side of neck  About the size of 1/2 inch . Wishes to trans fer to [facility].  [facility] is not accepting trans fer Pt.  At this time recc.  Patient make appointment at [facility]   Reason for Disposition . [1] Tender node in the groin AND [2] has a sore, scratch, cut or painful red area on that leg  Answer Assessment - Initial Assessment Questions 1. LOCATION: "Where is the swollen node located?" "Is the matching node on the other side of the body also swollen?"       Left side 2. SIZE: "How big is the node?" (Inches or centimeters) (or compare to common objects such as pea, bean, marble, golf ball)       1/2 inch approx 3. ONSET: "When did the swelling start?"      2 weeks ago 4. NECK NODES: "Is there a sore throat, runny nose or other symptoms of a cold?"      *No Answer*denies 5. GROIN OR ARMPIT NODES: "Is there a sore, scratch, cut or painful red area on that arm or leg?"      denies 6. FEVER: "Do you have a fever?" If so, ask: "What is it, how was it measured, and when did it start?"      denies 7. CAUSE: "What do you think is causing the swollen lymph nodes?"     Never for about 2 weeks 8. OTHER SYMPTOMS: "Do you have any other symptoms?"     Gastro/ getting colonoscopy Thursda 9. PREGNANCY: "Is there any chance you are pregnant?" "When was your last menstrual period?"     na  Protocols used: Lee

## 2018-08-06 NOTE — Telephone Encounter (Signed)
Copied from [place] [id number]. Topic: General - Call Back - No Documentation >> Aug 06, 2018 11:58 AM Erick Blinks wrote: Reason for CRM: Pt swollen lymph nodes for over two weeks, today swelling and numbness on left side of face. Please advise

## 2018-08-06 NOTE — Telephone Encounter (Signed)
Phone call dropped attempted to call no answer.

## 2018-08-06 NOTE — Telephone Encounter (Signed)
Noted thanks. Agree UCC today.

## 2018-08-07 ENCOUNTER — Telehealth: Payer: Self-pay | Admitting: Family Medicine

## 2018-08-07 NOTE — Telephone Encounter (Signed)
I scheduled pt for in pt appointment for swollen lymph nodes x 3 weeks  But when I did covid  Screening pt was positive for headache.  He stated he has has a headache off and on for [redacted] weeks along with swollen lymp notes  Is ok for in office or do you want virtual      IN OFFICE  [phone]  lymph nodes swollen in neck x 3 weeks/rbh no cough sob difficulty breathing chills fever muscle pain (headache on and off x 3 weeks) sore throat no loss taste or smell/rbh 6/5

## 2018-08-07 NOTE — Telephone Encounter (Signed)
Did not go to [facility] yesterday as advised? Ok in office for Monday.

## 2018-08-10 ENCOUNTER — Ambulatory Visit: Payer: Self-pay | Admitting: Family Medicine

## 2018-08-10 ENCOUNTER — Encounter: Payer: Self-pay | Admitting: Family Medicine

## 2018-08-10 ENCOUNTER — Other Ambulatory Visit: Payer: Self-pay

## 2018-08-10 VITALS — BP 100/66 | HR 65 | Temp 99.0°F | Ht 71.0 in | Wt 207.0 lb

## 2018-08-10 DIAGNOSIS — R1013 Epigastric pain: Secondary | ICD-10-CM

## 2018-08-10 DIAGNOSIS — R5383 Other fatigue: Secondary | ICD-10-CM

## 2018-08-10 DIAGNOSIS — R599 Enlarged lymph nodes, unspecified: Secondary | ICD-10-CM

## 2018-08-10 DIAGNOSIS — E785 Hyperlipidemia, unspecified: Secondary | ICD-10-CM

## 2018-08-10 LAB — COMPREHENSIVE METABOLIC PANEL
ALT: 16 U/L (ref 0–53)
AST: 11 U/L (ref 0–37)
Albumin: 4.9 g/dL (ref 3.5–5.2)
Alkaline Phosphatase: 55 U/L (ref 39–117)
BUN: 15 mg/dL (ref 6–23)
CO2: 27 mEq/L (ref 19–32)
Calcium: 9.6 mg/dL (ref 8.4–10.5)
Chloride: 103 mEq/L (ref 96–112)
Creatinine, Ser: 1.09 mg/dL (ref 0.40–1.50)
GFR: 76.9 mL/min (ref >60.00)
Glucose, Bld: 97 mg/dL (ref 70–99)
Potassium: 4.2 mEq/L (ref 3.5–5.1)
Sodium: 139 mEq/L (ref 135–145)
Total Bilirubin: 0.6 mg/dL (ref 0.2–1.2)
Total Protein: 7.4 g/dL (ref 6.0–8.3)

## 2018-08-10 LAB — VITAMIN B12: Vitamin B-12: 168 pg/mL — ABNORMAL LOW (ref 211–911)

## 2018-08-10 LAB — LIPID PANEL
Cholesterol: 277 mg/dL — ABNORMAL HIGH (ref 0–200)
HDL: 38.4 mg/dL — ABNORMAL LOW (ref >39.00)
NonHDL: 239
Total CHOL/HDL Ratio: 7
Triglycerides: 226 mg/dL — ABNORMAL HIGH (ref 0.0–149.0)
VLDL: 45.2 mg/dL — ABNORMAL HIGH (ref 0.0–40.0)

## 2018-08-10 LAB — VITAMIN D 25 HYDROXY (VIT D DEFICIENCY, FRACTURES): VITD: 22.1 ng/mL — ABNORMAL LOW (ref 30.00–100.00)

## 2018-08-10 LAB — POCT RAPID STREP A (OFFICE): Rapid Strep A Screen: NEGATIVE

## 2018-08-10 LAB — LIPASE: Lipase: 7 U/L — ABNORMAL LOW (ref 11.0–59.0)

## 2018-08-10 LAB — LDL CHOLESTEROL, DIRECT: Direct LDL: 204 mg/dL

## 2018-08-10 MED ORDER — AMOXICILLIN 875 MG PO TABS: 875.0000 mg | ORAL_TABLET | Freq: Two times a day (BID) | ORAL | 0 refills | Status: AC

## 2018-08-10 NOTE — Assessment & Plan Note (Signed)
Upcoming GI eval (EGD/colonoscopy).

## 2018-08-10 NOTE — Assessment & Plan Note (Signed)
Update labs.  

## 2018-08-10 NOTE — Patient Instructions (Signed)
Strep swab today Antibiotic to hold on to. Possibly dental related swollen glands -see what dentist says.  Will await GI evaluation.

## 2018-08-10 NOTE — Assessment & Plan Note (Signed)
I only appreciated mild R sided enlarged submandibular lymph nodes but not abnormally so - in h/o dental pain and possible chipped tooth anticipate symptoms may be stemming from dental issue - he will call and schedule dental appt for further evaluation. Did provide with WASP for amoxicillin course in case he can't get in to see dentist for several weeks. He agrees with plan.

## 2018-08-10 NOTE — Progress Notes (Signed)
This visit was conducted in person.  BP 100/66 (BP Location: Left Arm, Patient Position: Sitting, Cuff Size: Large)   Pulse 65   Temp 99 F (37.2 C) (Oral)   Ht 5\' 11"  (1.803 m)   Wt 207 lb (93.9 kg)   SpO2 99%   BMI 28.87 kg/m    CC: swollen glands Subjective:    Patient ID: Jason Munda., male    DOB: September 19, 1985, 33 y.o.   MRN: [id number]  HPI: Jason Vences. is a 33 y.o. male presenting on 08/10/2018 for Swollen Lymph Nodes In Neck (Noticed them about 3 weeks ago. )   Last seen in office 02/2017.   3 wk h/o cervical tender LN swelling without significant nasal congestion, cough, rhinorrhea. No fevers/chills, night sweats. Some ST, notes discomfort when swallowing. Some fatigue off and on for months.  Occasional PNDrainage and headache improved with OTC nasal sprays.  Treating with allergy medicine with benefit.   No sick contacts at home. No known strep exposure.  Has not had mono.  Did chip L upper tooth about 3 wks ago, with some pain that has since resolved. Hasn't seen dentist yet. Several years ago, did have R upper tooth apical abscess that he didn't notice that presented as recurrent sinus infection.   Upcoming EGD/colonoscopy this year for GI issues - decreased appetite, abnormal BMs, 15 lb weight loss over the last 3 weeks, ongoing nausea and indigestion, early satiety and decreased appetite.  Takes omeprazole 20mg  PRN with some benefit.      Relevant past medical, surgical, family and social history reviewed and updated as indicated. Interim medical history since our last visit reviewed. Allergies and medications reviewed and updated. Outpatient Medications Prior to Visit  Medication Sig Dispense Refill  . omeprazole (PRILOSEC) 20 MG capsule Take 20 mg by mouth every other day.    . Wheat Dextrin (BENEFIBER PO) Take by mouth daily.     No facility-administered medications prior to visit.      Per HPI unless specifically indicated in ROS section below  Review of Systems Objective:    BP 100/66 (BP Location: Left Arm, Patient Position: Sitting, Cuff Size: Large)   Pulse 65   Temp 99 F (37.2 C) (Oral)   Ht 5\' 11"  (1.803 m)   Wt 207 lb (93.9 kg)   SpO2 99%   BMI 28.87 kg/m   Wt Readings from Last 3 Encounters:  08/10/18 207 lb (93.9 kg)  08/04/18 215 lb (97.5 kg)  07/29/18 215 lb (97.5 kg)    Physical Exam Vitals signs and nursing note reviewed.  Constitutional:      General: He is not in acute distress.    Appearance: Normal appearance. He is well-developed. He is not ill-appearing.  HENT:     Head: Normocephalic and atraumatic.     Right Ear: Hearing, tympanic membrane, ear canal and external ear normal.     Left Ear: Hearing, tympanic membrane, ear canal and external ear normal.     Ears:     Comments: No pain at TMJ or at palpation along maxilla or mandible bilaterally    Nose: Nose normal.     Right Sinus: No maxillary sinus tenderness or frontal sinus tenderness.     Left Sinus: No maxillary sinus tenderness or frontal sinus tenderness.     Comments: No sinus tenderness    Mouth/Throat:     Lips: Pink.     Mouth: Mucous membranes are moist.  Tongue: No lesions.     Pharynx: Oropharynx is clear. Uvula midline. No oropharyngeal exudate or posterior oropharyngeal erythema.     Tonsils: No tonsillar exudate or tonsillar abscesses.     Comments: No enlargement or drainage from stensen's ducts. No pain to palpation of bilateral upper or lower teeth. Evidence of prior dental work (fillings) Eyes:     General: No scleral icterus.    Conjunctiva/sclera: Conjunctivae normal.     Pupils: Pupils are equal, round, and reactive to light.  Neck:     Musculoskeletal: Normal range of motion and neck supple.  Cardiovascular:     Rate and Rhythm: Normal rate and regular rhythm.     Pulses: Normal pulses.     Heart sounds: Normal heart sounds. No murmur.  Pulmonary:     Effort: Pulmonary effort is normal. No respiratory  distress.     Breath sounds: Normal breath sounds. No wheezing, rhonchi or rales.  Abdominal:     General: Abdomen is flat. Bowel sounds are normal. There is no distension.     Palpations: Abdomen is soft. There is no hepatomegaly, splenomegaly or mass.     Tenderness: There is abdominal tenderness (mild) in the epigastric area. There is no guarding or rebound. Negative signs include Murphy's sign.     Hernia: No hernia is present.  Musculoskeletal: Normal range of motion.     Right lower leg: No edema.     Left lower leg: No edema.  Lymphadenopathy:     Head:     Right side of head: Submandibular (small subcm tender LN) adenopathy present. No submental, tonsillar, preauricular or posterior auricular adenopathy.     Left side of head: No submental, submandibular, tonsillar, preauricular or posterior auricular adenopathy.     Cervical: No cervical adenopathy.     Upper Body:     Right upper body: No supraclavicular or axillary adenopathy.     Left upper body: No supraclavicular or axillary adenopathy.     Lower Body: No right inguinal adenopathy. No left inguinal adenopathy.  Skin:    General: Skin is warm and dry.     Findings: No rash.  Neurological:     General: No focal deficit present.     Mental Status: He is alert.     Comments: CN 2-12 intact  Psychiatric:        Mood and Affect: Mood normal.        Behavior: Behavior normal.       Results for orders placed or performed in visit on 08/10/18  HIV Antibody (routine testing w rflx)  Result Value Ref Range   HIV 1&2 Ab, 4th Generation neg   POCT rapid strep A  Result Value Ref Range   Rapid Strep A Screen Negative Negative   Assessment & Plan:   Problem List Items Addressed This Visit    HLD (hyperlipidemia)    Update labs.       Relevant Orders   Comprehensive metabolic panel   Lipid panel   Glands swollen - Primary    I only appreciated mild R sided enlarged submandibular lymph nodes but not abnormally so - in  h/o dental pain and possible chipped tooth anticipate symptoms may be stemming from dental issue - he will call and schedule dental appt for further evaluation. Did provide with WASP for amoxicillin course in case he can't get in to see dentist for several weeks. He agrees with plan.       Fatigue  Will check for reversible causes of fatigue.       Relevant Orders   Vitamin B12   VITAMIN D 25 Hydroxy (Vit-D Deficiency, Fractures)   POCT rapid strep A (Completed)   Epigastric abdominal pain    Upcoming GI eval (EGD/colonoscopy).       Relevant Orders   Lipase       Meds ordered this encounter  Medications  . amoxicillin (AMOXIL) 875 MG tablet    Sig: Take 1 tablet (875 mg total) by mouth 2 (two) times daily.    Dispense:  14 tablet    Refill:  0   Orders Placed This Encounter  Procedures  . Comprehensive metabolic panel  . Lipid panel  . Vitamin B12  . VITAMIN D 25 Hydroxy (Vit-D Deficiency, Fractures)  . Lipase  . HIV Antibody (routine testing w rflx)    This external order was created through the Results Console.  Marland Kitchen POCT rapid strep A    Follow up plan: Return if symptoms worsen or fail to improve.  Ria Bush, MD

## 2018-08-10 NOTE — Assessment & Plan Note (Signed)
Will check for reversible causes of fatigue.

## 2018-08-12 ENCOUNTER — Telehealth: Payer: Self-pay | Admitting: Internal Medicine

## 2018-08-12 ENCOUNTER — Telehealth: Payer: Self-pay | Admitting: Family Medicine

## 2018-08-12 ENCOUNTER — Telehealth: Payer: Self-pay | Admitting: *Deleted

## 2018-08-12 NOTE — Telephone Encounter (Signed)
Dr. Hilarie Fredrickson,  This patient called to day because he has some symptoms that have developed/ he didn't mention at his telemedicine visit- he has lost 15 lbs in 3 weeks, he has no appetite and he has some enlarged lymph nodes in his neck- he has had those for about 3 weeks. His procedure is tomorrow and I did tell him that you would speak to him before he received sedation  Thanks, Cyril Mourning

## 2018-08-12 NOTE — Telephone Encounter (Signed)
Patient stated he had lab work done 6/2 and has not seen the results in his my chart. Calling to receive those results    Patient's Phone- [phone]

## 2018-08-12 NOTE — Telephone Encounter (Signed)
Left message on vm per dpr informing pt the 08/04/18 labs were done with Dr. Zenovia Jarred.  Suggeted he contact their office for those results.

## 2018-08-12 NOTE — Telephone Encounter (Signed)
I ordered labs on Monday. I released them via MyChart.

## 2018-08-12 NOTE — Telephone Encounter (Signed)

## 2018-08-13 ENCOUNTER — Other Ambulatory Visit: Payer: Self-pay

## 2018-08-13 ENCOUNTER — Ambulatory Visit (AMBULATORY_SURGERY_CENTER): Payer: Self-pay | Admitting: Internal Medicine

## 2018-08-13 ENCOUNTER — Encounter: Payer: Self-pay | Admitting: Internal Medicine

## 2018-08-13 VITALS — BP 125/73 | HR 64 | Temp 98.7°F | Resp 17 | Ht 71.0 in | Wt 215.0 lb

## 2018-08-13 DIAGNOSIS — K3189 Other diseases of stomach and duodenum: Secondary | ICD-10-CM

## 2018-08-13 DIAGNOSIS — D131 Benign neoplasm of stomach: Secondary | ICD-10-CM

## 2018-08-13 DIAGNOSIS — R1013 Epigastric pain: Secondary | ICD-10-CM

## 2018-08-13 DIAGNOSIS — K317 Polyp of stomach and duodenum: Secondary | ICD-10-CM

## 2018-08-13 DIAGNOSIS — K635 Polyp of colon: Secondary | ICD-10-CM

## 2018-08-13 DIAGNOSIS — K2970 Gastritis, unspecified, without bleeding: Secondary | ICD-10-CM

## 2018-08-13 DIAGNOSIS — D123 Benign neoplasm of transverse colon: Secondary | ICD-10-CM

## 2018-08-13 DIAGNOSIS — K30 Functional dyspepsia: Secondary | ICD-10-CM

## 2018-08-13 DIAGNOSIS — R194 Change in bowel habit: Secondary | ICD-10-CM

## 2018-08-13 DIAGNOSIS — E755 Other lipid storage disorders: Secondary | ICD-10-CM

## 2018-08-13 DIAGNOSIS — R634 Abnormal weight loss: Secondary | ICD-10-CM

## 2018-08-13 DIAGNOSIS — K134 Granuloma and granuloma-like lesions of oral mucosa: Secondary | ICD-10-CM

## 2018-08-13 MED ORDER — PANTOPRAZOLE SODIUM 40 MG PO TBEC: 40.0000 mg | DELAYED_RELEASE_TABLET | Freq: Every day | ORAL | 3 refills | Status: AC

## 2018-08-13 MED ORDER — OMEPRAZOLE 40 MG PO CPDR: 40.0000 mg | DELAYED_RELEASE_CAPSULE | Freq: Every day | ORAL | 3 refills | Status: DC

## 2018-08-13 MED ORDER — SODIUM CHLORIDE 0.9 % IV SOLN: 500.0000 mL | Freq: Once | INTRAVENOUS | Status: DC

## 2018-08-13 NOTE — Patient Instructions (Addendum)
Please review procedure reports for medication changes.   Handouts Provided:  Polyps  YOU HAD AN ENDOSCOPIC PROCEDURE TODAY AT [facility]:   Refer to the procedure report that was given to you for any specific questions about what was found during the examination.  If the procedure report does not answer your questions, please call your gastroenterologist to clarify.  If you requested that your care partner not be given the details of your procedure findings, then the procedure report has been included in a sealed envelope for you to review at your convenience later.  YOU SHOULD EXPECT: Some feelings of bloating in the abdomen. Passage of more gas than usual.  Walking can help get rid of the air that was put into your GI tract during the procedure and reduce the bloating. If you had a lower endoscopy (such as a colonoscopy or flexible sigmoidoscopy) you may notice spotting of blood in your stool or on the toilet paper. If you underwent a bowel prep for your procedure, you may not have a normal bowel movement for a few days.  Please Note:  You might notice some irritation and congestion in your nose or some drainage.  This is from the oxygen used during your procedure.  There is no need for concern and it should clear up in a day or so.  SYMPTOMS TO REPORT IMMEDIATELY:   Following lower endoscopy (colonoscopy or flexible sigmoidoscopy):  Excessive amounts of blood in the stool  Significant tenderness or worsening of abdominal pains  Swelling of the abdomen that is new, acute  Fever of 100F or higher   Following upper endoscopy (EGD)  Vomiting of blood or coffee ground material  New chest pain or pain under the shoulder blades  Painful or persistently difficult swallowing  New shortness of breath  Fever of 100F or higher  Black, tarry-looking stools  For urgent or emergent issues, a gastroenterologist can be reached at any hour by calling [phone].   DIET:  We  do recommend a small meal at first, but then you may proceed to your regular diet.  Drink plenty of fluids but you should avoid alcoholic beverages for 24 hours.  ACTIVITY:  You should plan to take it easy for the rest of today and you should NOT DRIVE or use heavy machinery until tomorrow (because of the sedation medicines used during the test).    FOLLOW UP: Our staff will call the number listed on your records 48-72 hours following your procedure to check on you and address any questions or concerns that you may have regarding the information given to you following your procedure. If we do not reach you, we will leave a message.  We will attempt to reach you two times.  During this call, we will ask if you have developed any symptoms of COVID 19. If you develop any symptoms (ie: fever, flu-like symptoms, shortness of breath, cough etc.) before then, please call [phone].  If you test positive for Covid 19 in the 2 weeks post procedure, please call and report this information to [place].    If any biopsies were taken you will be contacted by phone or by letter within the next 1-3 weeks.  Please call us at [phone] if you have not heard about the biopsies in 3 weeks.    SIGNATURES/CONFIDENTIALITY: You and/or your care partner have signed paperwork which will be entered into your electronic medical record.  These signatures attest to the fact that  that the information above on your After Visit Summary has been reviewed and is understood.  Full responsibility of the confidentiality of this discharge information lies with you and/or your care-partner.

## 2018-08-13 NOTE — Op Note (Signed)
[facility] Patient Name: Jason Wood Procedure Date: 08/13/2018 2:49 PM MRN: [id number] Endoscopist: Jerene Bears , MD Age: 33 Referring MD:  Date of Birth: 01-06-1986 Gender: Male Account #: [id number] Procedure:                Upper GI endoscopy Indications:              Epigastric abdominal pain, Indigestion, Weight loss Medicines:                Monitored Anesthesia Care Procedure:                Pre-Anesthesia Assessment:                           - Prior to the procedure, a History and Physical                            was performed, and patient medications and                            allergies were reviewed. The patient's tolerance of                            previous anesthesia was also reviewed. The risks                            and benefits of the procedure and the sedation                            options and risks were discussed with the patient.                            All questions were answered, and informed consent                            was obtained. Prior Anticoagulants: The patient has                            taken no previous anticoagulant or antiplatelet                            agents. ASA Grade Assessment: II - A patient with                            mild systemic disease. After reviewing the risks                            and benefits, the patient was deemed in                            satisfactory condition to undergo the procedure.                           After obtaining informed consent, the endoscope was  passed under direct vision. Throughout the                            procedure, the patient's blood pressure, pulse, and                            oxygen saturations were monitored continuously. The                            Endoscope was introduced through the mouth, and                            advanced to the second part of duodenum. The upper                            GI  endoscopy was accomplished without difficulty.                            The patient tolerated the procedure well. Scope In: Scope Out: Findings:                 The examined esophagus was normal.                           A single 7 mm semi-sessile polyp with no bleeding                            was found in the gastric body. The polyp was                            removed with a hot snare. Resection and retrieval                            were complete.                           The exam of the stomach was otherwise normal.                           Biopsies were taken with a cold forceps in the                            gastric body, at the incisura and in the gastric                            antrum for histology and Helicobacter pylori                            testing.                           The examined duodenum was normal. Biopsies were                            taken with a  cold forceps for histology. Complications:            No immediate complications. Estimated Blood Loss:     Estimated blood loss was minimal. Impression:               - Normal esophagus.                           - A single gastric polyp. Resected and retrieved.                           - Normal examined duodenum. Biopsied.                           - Biopsies were taken with a cold forceps for                            histology and Helicobacter pylori testing. Recommendation:           - Patient has a contact number available for                            emergencies. The signs and symptoms of potential                            delayed complications were discussed with the                            patient. Return to normal activities tomorrow.                            Written discharge instructions were provided to the                            patient.                           - Resume previous diet.                           - Continue present medications. Increase omeprazole                             to 40 mg once daily (after polypectomy).                           - No aspirin, ibuprofen, naproxen, or other                            non-steroidal anti-inflammatory drugs for 2 weeks                            after polyp removal.                           - Await pathology results.                           -  See the other procedure note for documentation of                            additional recommendations. Jerene Bears, MD 08/13/2018 3:36:00 PM This report has been signed electronically.

## 2018-08-13 NOTE — Op Note (Signed)
[facility] Patient Name: Jason Wood Procedure Date: 08/13/2018 2:48 PM MRN: [id number] Endoscopist: Jerene Bears , MD Age: 33 Referring MD:  Date of Birth: 1985-12-30 Gender: Male Account #: [id number] Procedure:                Colonoscopy Indications:              Epigastric abdominal pain, Change in bowel habits,                            Weight loss Medicines:                Monitored Anesthesia Care Procedure:                Pre-Anesthesia Assessment:                           - Prior to the procedure, a History and Physical                            was performed, and patient medications and                            allergies were reviewed. The patient's tolerance of                            previous anesthesia was also reviewed. The risks                            and benefits of the procedure and the sedation                            options and risks were discussed with the patient.                            All questions were answered, and informed consent                            was obtained. Prior Anticoagulants: The patient has                            taken no previous anticoagulant or antiplatelet                            agents. ASA Grade Assessment: II - A patient with                            mild systemic disease. After reviewing the risks                            and benefits, the patient was deemed in                            satisfactory condition to undergo the procedure.  After obtaining informed consent, the colonoscope                            was passed under direct vision. Throughout the                            procedure, the patient's blood pressure, pulse, and                            oxygen saturations were monitored continuously. The                            Colonoscope was introduced through the anus and                            advanced to the terminal ileum. The colonoscopy was                          performed without difficulty. The patient tolerated                            the procedure well. The quality of the bowel                            preparation was excellent. The terminal ileum,                            ileocecal valve, appendiceal orifice, and rectum                            were photographed. Scope In: 3:10:43 PM Scope Out: 3:26:03 PM Scope Withdrawal Time: 0 hours 10 minutes 25 seconds  Total Procedure Duration: 0 hours 15 minutes 20 seconds  Findings:                 The digital rectal exam was normal.                           The terminal ileum appeared normal.                           A 4 mm polyp was found in the transverse colon. The                            polyp was sessile. The polyp was removed with a                            cold snare. Resection and retrieval were complete.                           The entire examined colon appeared normal on direct                            and retroflexion views. Complications:  No immediate complications. Estimated Blood Loss:     Estimated blood loss was minimal. Impression:               - The examined portion of the ileum was normal.                           - One 4 mm polyp in the transverse colon, removed                            with a cold snare. Resected and retrieved.                           - The entire examined colon is normal on direct and                            retroflexion views. Recommendation:           - Patient has a contact number available for                            emergencies. The signs and symptoms of potential                            delayed complications were discussed with the                            patient. Return to normal activities tomorrow.                            Written discharge instructions were provided to the                            patient.                           - Resume previous diet.                            - Continue present medications. Continue Benefiber                            daily.                           - Await pathology results.                           - Repeat colonoscopy is recommended. The                            colonoscopy date will be determined after pathology                            results from today's exam become available for  review.                           - Begin oral Vitamin B 12 1000 mcg daily; Begin                            oral Vit D3 2000 IU daily. Repeat B12 in 8 weeks.                            Repeat Vit D with per Dr. Danise Mina. Jerene Bears, MD 08/13/2018 3:40:42 PM This report has been signed electronically.

## 2018-08-13 NOTE — Progress Notes (Signed)
PT taken to PACU. Monitors in place. VSS. Report given to RN. 

## 2018-08-13 NOTE — Progress Notes (Signed)
Pt's states no medical or surgical changes since previsit or office visit. (via telehealth)

## 2018-08-17 ENCOUNTER — Telehealth: Payer: Self-pay | Admitting: *Deleted

## 2018-08-17 NOTE — Telephone Encounter (Signed)
  Follow up Call-  Call back number 08/13/2018  Post procedure Call Back phone  # [phone]  Permission to leave phone message Yes  Some recent data might be hidden     Patient questions:  Do you have a fever, pain , or abdominal swelling? No. Pain Score  0 *  Have you tolerated food without any problems? Yes.    Have you been able to return to your normal activities? Yes.    Do you have any questions about your discharge instructions: Diet   No. Medications  No. Follow up visit  No.  Do you have questions or concerns about your Care? No.  Actions: * If pain score is 4 or above: No action needed, pain <4.  1. Have you developed a fever since your procedure? NO  2.   Have you had an respiratory symptoms (SOB or cough) since your procedure? NO  3.   Have you tested positive for COVID 19 since your procedure NO  4.   Have you had any family members/close contacts diagnosed with the COVID 19 since your procedure?  NO   If yes to any of these questions please route to Joylene Branston, RN and Alphonsa Gin, RN.

## 2018-08-24 ENCOUNTER — Other Ambulatory Visit: Payer: Self-pay

## 2018-08-24 DIAGNOSIS — E538 Deficiency of other specified B group vitamins: Secondary | ICD-10-CM

## 2018-09-26 ENCOUNTER — Encounter: Payer: Self-pay | Admitting: Family Medicine

## 2018-10-15 NOTE — Telephone Encounter (Signed)
Proceed with CT abd/pelvis with contrast for eval of RLQ abd pain Eval for IBD

## 2018-10-22 ENCOUNTER — Other Ambulatory Visit: Payer: Self-pay

## 2018-10-22 DIAGNOSIS — R1031 Right lower quadrant pain: Secondary | ICD-10-CM

## 2018-10-26 ENCOUNTER — Other Ambulatory Visit: Payer: Self-pay

## 2018-10-26 ENCOUNTER — Ambulatory Visit (INDEPENDENT_AMBULATORY_CARE_PROVIDER_SITE_OTHER)
Admission: RE | Admit: 2018-10-26 | Discharge: 2018-10-26 | Disposition: A | Discharge status: Home / Self Care | Payer: Self-pay | Source: Ambulatory Visit | Attending: Internal Medicine | Admitting: Internal Medicine

## 2018-10-26 DIAGNOSIS — R1031 Right lower quadrant pain: Secondary | ICD-10-CM

## 2018-10-26 MED ORDER — IOHEXOL 300 MG/ML  SOLN
100.0000 mL | Freq: Once | INTRAMUSCULAR | Status: AC | PRN
  Administered 2018-10-26: 15:00:00 100 mL via INTRAVENOUS

## 2019-01-12 ENCOUNTER — Encounter: Payer: Self-pay | Admitting: Family Medicine

## 2019-01-12 DIAGNOSIS — E785 Hyperlipidemia, unspecified: Secondary | ICD-10-CM

## 2019-01-12 MED ORDER — ROSUVASTATIN CALCIUM 10 MG PO TABS: 10.0000 mg | ORAL_TABLET | Freq: Every day | ORAL | 3 refills | Status: AC

## 2019-01-12 NOTE — Telephone Encounter (Signed)
Start crestor given persistently elevated lipid levels.  The ASCVD Risk score Mikey Bussing DC Jr., et al., 2013) failed to calculate for the following reasons:   The 2013 ASCVD risk score is only valid for ages 74 to 72

## 2020-08-01 ENCOUNTER — Other Ambulatory Visit
Admission: RE | Admit: 2020-08-01 | Discharge: 2020-08-01 | Disposition: A | Discharge status: Home / Self Care | Payer: Self-pay | Source: Ambulatory Visit | Attending: Podiatry | Admitting: Podiatry

## 2020-08-01 DIAGNOSIS — M10072 Idiopathic gout, left ankle and foot: Secondary | ICD-10-CM | POA: Insufficient documentation

## 2020-08-01 DIAGNOSIS — M25572 Pain in left ankle and joints of left foot: Secondary | ICD-10-CM | POA: Insufficient documentation

## 2020-08-01 LAB — SYNOVIAL CELL COUNT + DIFF, W/ CRYSTALS
Crystals, Fluid: NONE SEEN
Eosinophils-Synovial: 0 %
Lymphocytes-Synovial Fld: 62 %
Monocyte-Macrophage-Synovial Fluid: 30 %
Neutrophil, Synovial: 8 %
WBC, Synovial: 346 /mm3 — ABNORMAL HIGH (ref 0–200)

## 2020-09-06 ENCOUNTER — Other Ambulatory Visit: Payer: Self-pay | Admitting: Podiatry

## 2020-09-06 DIAGNOSIS — M25372 Other instability, left ankle: Secondary | ICD-10-CM

## 2020-09-06 DIAGNOSIS — M79672 Pain in left foot: Secondary | ICD-10-CM

## 2020-09-11 ENCOUNTER — Other Ambulatory Visit: Payer: Self-pay

## 2020-09-11 ENCOUNTER — Ambulatory Visit
Admission: RE | Admit: 2020-09-11 | Discharge: 2020-09-11 | Disposition: A | Discharge status: Home / Self Care | Payer: Self-pay | Source: Ambulatory Visit | Attending: Podiatry | Admitting: Podiatry

## 2020-09-11 DIAGNOSIS — M25372 Other instability, left ankle: Secondary | ICD-10-CM | POA: Insufficient documentation

## 2020-09-11 DIAGNOSIS — M79672 Pain in left foot: Secondary | ICD-10-CM | POA: Insufficient documentation

## 2023-03-09 IMAGING — MR MR FOOT*L* W/O CM
6 series · 40 of 40 positions shown · non-contrast
Comparison: None.

CLINICAL DATA: Generalized foot and ankle pain with swelling for 6
weeks. No known injury.

EXAM:
MRI OF THE LEFT FOOT WITHOUT CONTRAST
TECHNIQUE: Multiplanar, multisequence MR imaging of the left forefoot was
performed. No intravenous contrast was administered.

[Series 3: T1 · coronal · left · 3.0mm · 0.47mm/px · 7 of 43 slices shown (1 of 2)]
[im 1/43]
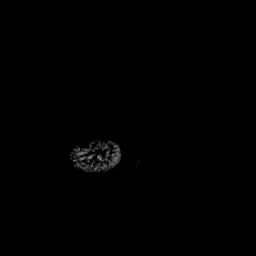
[im 8/43]
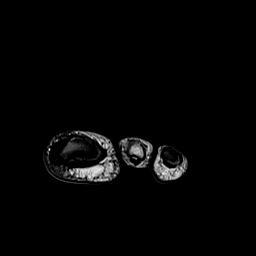
[im 15/43]
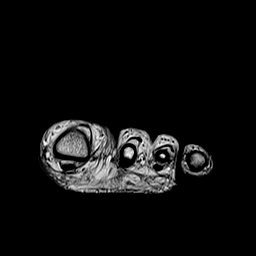
[im 22/43]
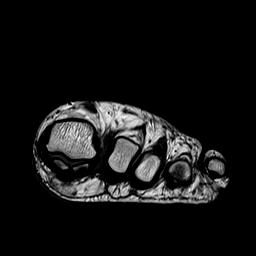
[im 29/43]
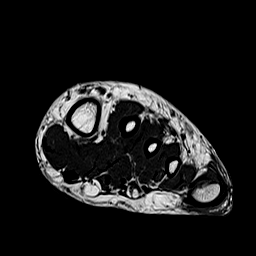
[im 36/43]
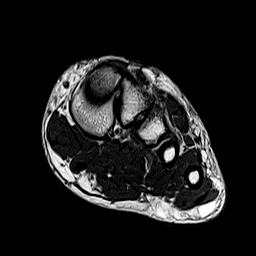
[im 43/43]
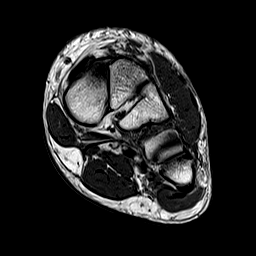

[Series 4: T2 · coronal · left · 3.0mm · 0.47mm/px · 7 of 43 slices shown (1 of 2)]
[im 1/43]
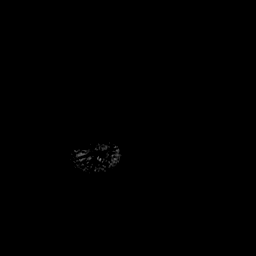
[im 8/43]
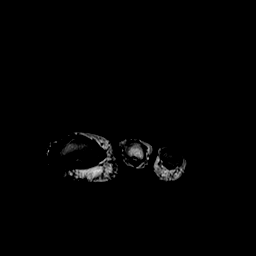
[im 15/43]
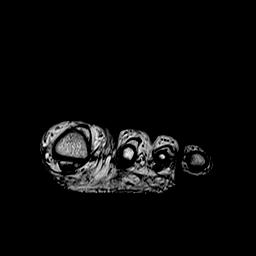
[im 22/43]
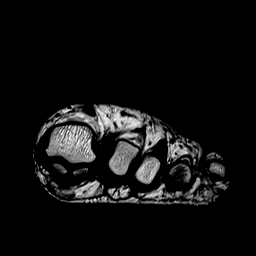
[im 29/43]
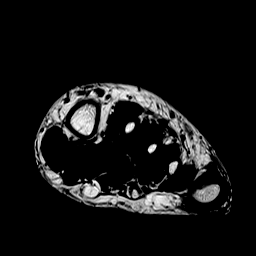
[im 36/43]
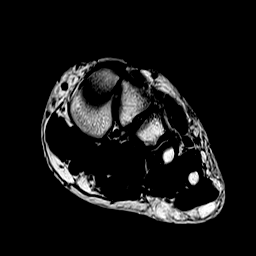
[im 43/43]
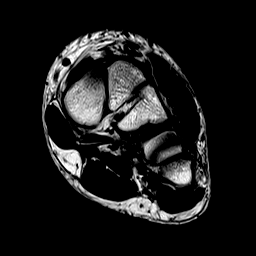

[Series 5: T2 · coronal · left · 3.0mm · 0.47mm/px · 8 of 43 slices shown (2 of 2)]
[im 1/43]
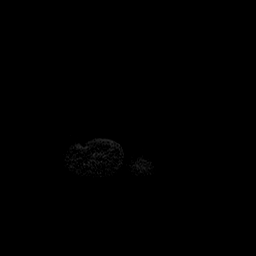
[im 7/43]
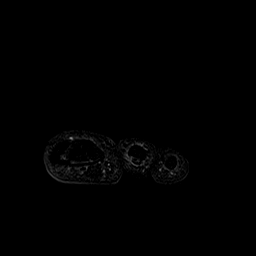
[im 13/43]
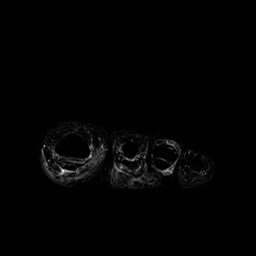
[im 19/43]
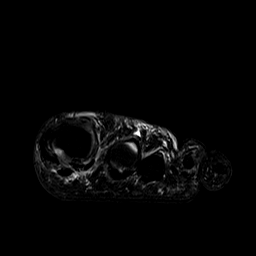
[im 25/43]
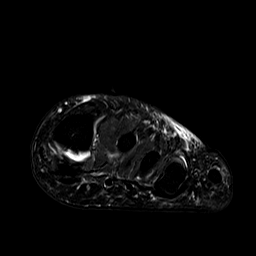
[im 31/43]
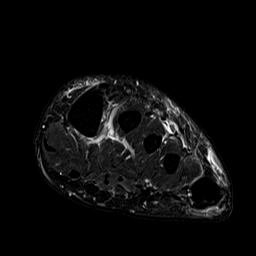
[im 37/43]
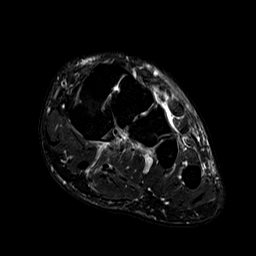
[im 43/43]
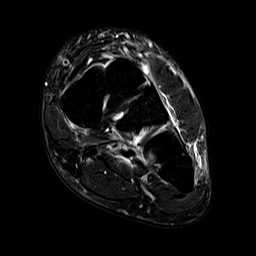

[Series 6: T2 fat-sat · axial · left · 3.0mm · 0.47mm/px · z∈[-108,-12]mm · 5 of 26 slices shown]
[im 1/26]
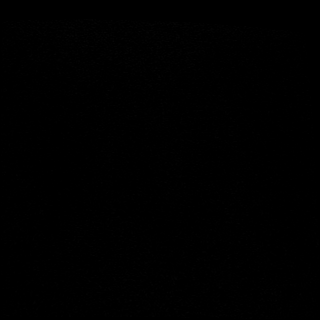
[im 7/26]
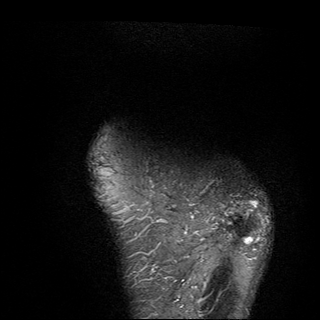
[im 13/26]
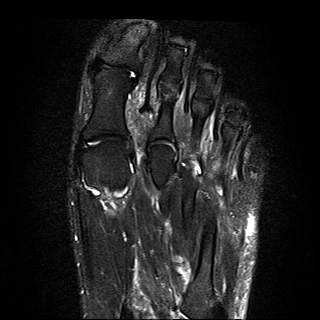
[im 19/26]
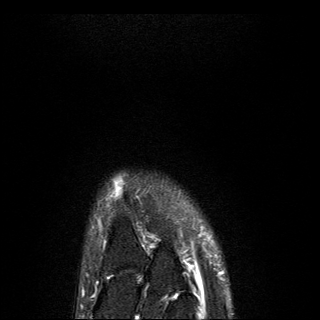
[im 26/26]
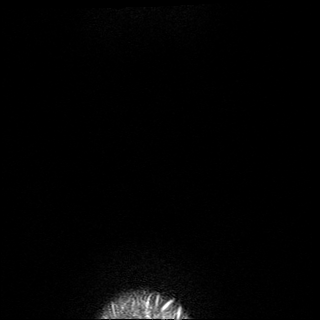

[Series 8: T1 · axial · left · 3.0mm · 0.59mm/px · z∈[-109,-13]mm · 5 of 26 slices shown (2 of 2)]
[im 1/26]
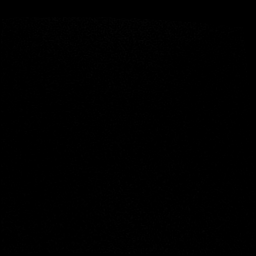
[im 7/26]
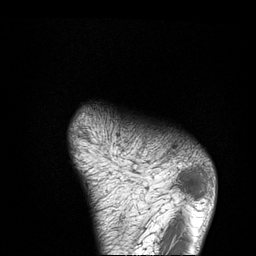
[im 13/26]
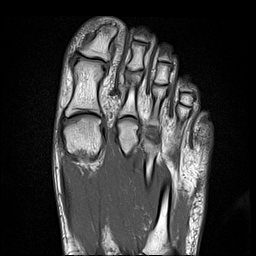
[im 19/26]
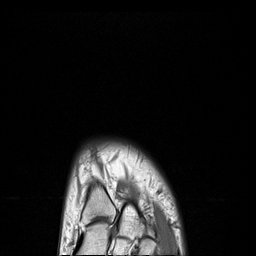
[im 26/26]
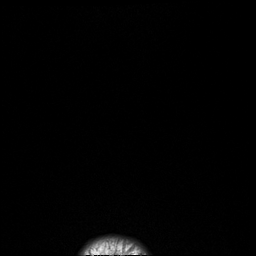

[Series 9: PD fat-sat · sagittal · left · 2.0mm · 0.59mm/px · 8 of 45 slices shown]
[im 1/45]
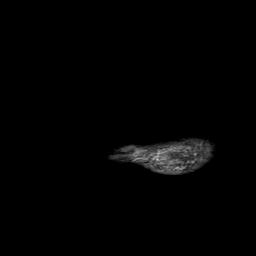
[im 7/45]
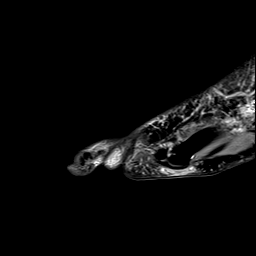
[im 13/45]
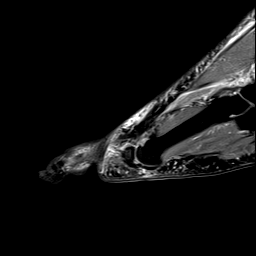
[im 19/45]
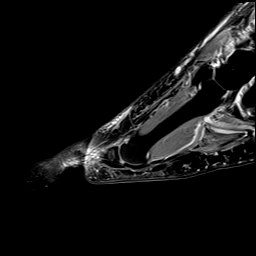
[im 26/45]
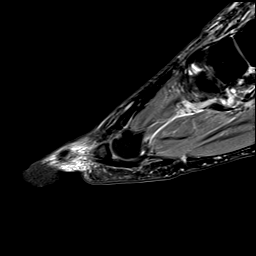
[im 32/45]
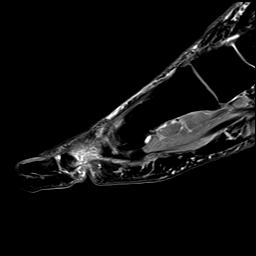
[im 38/45]
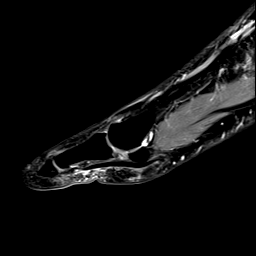
[im 45/45]
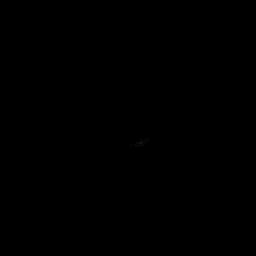

[40 of 40 positions shown; findings below may reference images not displayed]

FINDINGS: Bones/Joint/Cartilage

No evidence of acute fracture, dislocation or significant joint
effusion. There are minimal degenerative changes at the 1st
metatarsophalangeal joint. The additional joint spaces are
preserved.

Ligaments

The Lisfranc ligament is intact. The collateral ligaments of the
metatarsophalangeal joints are intact.

Muscles and Tendons

Unremarkable.

Soft tissues

Mild nonspecific dorsal forefoot subcutaneous edema without focal
fluid collection. No evidence of foreign body or soft tissue
emphysema.
IMPRESSION: 1. Mild nonspecific dorsal forefoot subcutaneous edema without focal
fluid collection. See separate report of the ankle.
2. The forefoot muscles and tendons appear normal.
3. No acute osseous findings.
